# Patient Record
Sex: Female | Born: 1990 | Hispanic: No | Marital: Married | Smoking: Never smoker
Health system: Southern US, Community
[De-identification: ages and names within clinical notes are randomized; demographics above are authoritative.]

## PROBLEM LIST (undated history)

## (undated) DIAGNOSIS — D649 Anemia, unspecified: Secondary | ICD-10-CM

## (undated) DIAGNOSIS — R519 Headache, unspecified: Secondary | ICD-10-CM

## (undated) DIAGNOSIS — R87629 Unspecified abnormal cytological findings in specimens from vagina: Secondary | ICD-10-CM

## (undated) HISTORY — PX: TONSILLECTOMY: SUR1361

## (undated) HISTORY — DX: Headache, unspecified: R51.9

## (undated) HISTORY — DX: Anemia, unspecified: D64.9

## (undated) HISTORY — DX: Unspecified abnormal cytological findings in specimens from vagina: R87.629

---

## 2020-05-22 NOTE — L&D Delivery Note (Signed)
Called to come to delivery when patient was 7 cm and SROM.  Arrived in 5 " to a precipitous delivery attended by Ob Doc on call of a Female Apgars 9,9.   I arrived and delivered placenta with 3vc.  Cord blood obtained. Small posterior forchette 1st degree lac 1% lidocaine then 3-0 chromic repair Mother and baby doing well.  EBL 100cc

## 2020-08-24 LAB — OB RESULTS CONSOLE GC/CHLAMYDIA
Chlamydia: NEGATIVE
Gonorrhea: NEGATIVE

## 2020-08-24 LAB — OB RESULTS CONSOLE ANTIBODY SCREEN: Antibody Screen: NEGATIVE

## 2020-08-24 LAB — OB RESULTS CONSOLE HIV ANTIBODY (ROUTINE TESTING): HIV: NONREACTIVE

## 2020-08-24 LAB — OB RESULTS CONSOLE RPR: RPR: NONREACTIVE

## 2020-08-24 LAB — OB RESULTS CONSOLE ABO/RH: RH Type: NEGATIVE

## 2020-08-24 LAB — OB RESULTS CONSOLE RUBELLA ANTIBODY, IGM: Rubella: IMMUNE

## 2020-08-24 LAB — OB RESULTS CONSOLE HEPATITIS B SURFACE ANTIGEN: Hepatitis B Surface Ag: NEGATIVE

## 2020-09-21 ENCOUNTER — Encounter: Payer: Self-pay | Admitting: *Deleted

## 2020-09-27 ENCOUNTER — Ambulatory Visit: Payer: PRIVATE HEALTH INSURANCE | Admitting: *Deleted

## 2020-09-27 ENCOUNTER — Ambulatory Visit (HOSPITAL_BASED_OUTPATIENT_CLINIC_OR_DEPARTMENT_OTHER): Payer: PRIVATE HEALTH INSURANCE

## 2020-09-27 ENCOUNTER — Other Ambulatory Visit: Payer: Self-pay | Admitting: *Deleted

## 2020-09-27 ENCOUNTER — Other Ambulatory Visit: Payer: Self-pay | Admitting: Obstetrics and Gynecology

## 2020-09-27 ENCOUNTER — Ambulatory Visit: Payer: PRIVATE HEALTH INSURANCE | Attending: Obstetrics and Gynecology | Admitting: Obstetrics

## 2020-09-27 ENCOUNTER — Other Ambulatory Visit: Payer: Self-pay

## 2020-09-27 ENCOUNTER — Encounter: Payer: Self-pay | Admitting: *Deleted

## 2020-09-27 VITALS — BP 103/63 | HR 74 | Ht 64.0 in

## 2020-09-27 DIAGNOSIS — M329 Systemic lupus erythematosus, unspecified: Secondary | ICD-10-CM

## 2020-09-27 DIAGNOSIS — O99891 Other specified diseases and conditions complicating pregnancy: Secondary | ICD-10-CM | POA: Insufficient documentation

## 2020-09-27 DIAGNOSIS — Z3A13 13 weeks gestation of pregnancy: Secondary | ICD-10-CM | POA: Insufficient documentation

## 2020-09-27 DIAGNOSIS — D6862 Lupus anticoagulant syndrome: Secondary | ICD-10-CM

## 2020-09-27 DIAGNOSIS — Z363 Encounter for antenatal screening for malformations: Secondary | ICD-10-CM

## 2020-09-27 DIAGNOSIS — O99113 Other diseases of the blood and blood-forming organs and certain disorders involving the immune mechanism complicating pregnancy, third trimester: Secondary | ICD-10-CM

## 2020-09-27 DIAGNOSIS — O99111 Other diseases of the blood and blood-forming organs and certain disorders involving the immune mechanism complicating pregnancy, first trimester: Secondary | ICD-10-CM

## 2020-09-27 DIAGNOSIS — Z3A12 12 weeks gestation of pregnancy: Secondary | ICD-10-CM

## 2020-09-29 DIAGNOSIS — Z363 Encounter for antenatal screening for malformations: Secondary | ICD-10-CM | POA: Diagnosis not present

## 2020-09-29 DIAGNOSIS — O99111 Other diseases of the blood and blood-forming organs and certain disorders involving the immune mechanism complicating pregnancy, first trimester: Secondary | ICD-10-CM

## 2020-09-29 DIAGNOSIS — D6862 Lupus anticoagulant syndrome: Secondary | ICD-10-CM

## 2020-09-29 DIAGNOSIS — Z3A13 13 weeks gestation of pregnancy: Secondary | ICD-10-CM

## 2020-09-29 NOTE — Progress Notes (Signed)
MFM Consult Note  Kaylee Cervantes is a gravida 3 para 2 with an EDC of April 02, 2021 currently at 13 weeks and 2 days.  She was seen in consultation due to a history of lupus.  She reports that she was diagnosed with lupus in September 2017.  Her primary symptoms when she was diagnosed with lupus were hair loss, fatigue, body aches, and low blood pressures. Her lupus is being managed with her rheumatologist at [facility], Dr. Zenovia Jordan.  She reports that her last lupus flare was probably about a year ago.  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  She reports that the vasculitis has only started to improve after she was started on azathioprine.  The patient's current medications are:   Hydroxychloroquine (Plaquenil) 200 mg daily.  She reports that her last eye exam was performed in January 2021 which was within normal limits.   Prednisone 5 mg daily Azathioprine (Imuran) 150 mg daily.  The patient reports that she is taking azathioprine for treatment of vasculitis. Daily baby aspirin  Prenatal vitamins Vitamin D  She reports that she has screened positive for the SSA and SSB antibodies in the past.  She denies any other significant past medical history.  Her past obstetrical history includes a 37-week vaginal delivery and a 36-week vaginal delivery due to spontaneous preterm labor.  She is planning on receiving weekly 17-P injections in her current pregnancy to decrease her risk of another preterm birth.  The patient reports that she was followed by MFM in her last pregnancy in [place] due to lupus.  Her past surgical history includes removal of tonsils and adenoids in 2004.  An ultrasound performed today shows a singleton intrauterine pregnancy with a crown-rump length that is consistent with an EDC of April 02, 2021.  The patient reports that she had a cell free DNA test drawn last week to screen for fetal aneuploidy.  The  results of that test are currently pending.  During our consultation today, the following issues were discussed:  Lupus and pregnancy  The implications and management of lupus in pregnancy was discussed in detail with the patient today.  The increased risk of fetal growth restriction, an indicated preterm delivery, and early onset preeclampsia associated with lupus in pregnancy was discussed.     As her lupus is now under control with treatment, she was advised to continue taking Plaquenil and prednisone for the duration of her pregnancy as recommended by her rheumatologist.  Plaquenil and prednisone have been used safely in pregnancy.  As azathioprine is the only medication that that is treating her vasculitis, she was advised to continue taking azathioprine throughout her pregnancy as recommended by her rheumatologist.  She was advised that there is limited data regarding the use of azathioprine in pregnancy.  There have been some reports of fetal anomalies associated with the use of azathioprine, although some of the newer data have indicated that it is probably safe to use in pregnancy.  As the benefits of azathioprine use in her pregnancy probably outweighs the risk, she may continue this medication.  The 1% to 2% risk of a fetal congenital heart block in women who screen  positive for SSA, SSB antibodies was discussed.  She was advised that there has been some anecdotal evidence indicating that for women who are positive for the SSA or SSB antibodies, that taking Plaquenil may decrease the risk of congenital heart block.  The monitoring  of weekly PR intervals has not been shown to be effective in the prevention of a congenital heart block.  We will refer her for a fetal echocardiogram at around 22 weeks and will follow her with ultrasounds throughout her pregnancy.  She understands that should her fetus develop a congenital heart block, that treatment with a pacemaker after birth may be  necessary.  Due to lupus in pregnancy, she should undergo a 24-hour urine collection soon to determine the total amount of protein that she is excreting. I have scheduled a detailed fetal anatomy scan for her in our office at around 19 weeks.  We will continue to follow her with serial growth ultrasounds every 4 to 5 weeks. Weekly fetal testing should be started at around 32 weeks.  She should continue close follow-up with her rheumatologist throughout her pregnancy.  As she will have been treated with prednisone for the duration of her pregnancy, I would recommend that she receive stress dose steroids (Hydrocortisone 100 mg IV every 8 hours x3 doses) after delivery.  She should continue a daily baby aspirin for preeclampsia prophylaxis.  I advised the patient that due to her history of a prior preterm birth (at 36 weeks), she may receive weekly 17-P injections starting at around 16 weeks and continued until 36 weeks if she desires.  As her prior preterm birth was at 36 weeks, it is questionable if the weekly 17-P injections will provide her with a robust improvement in her pregnancy outcome. She undertsands that an indicated preterm delivery may be necessary should she develop any complications related to lupus.  The patient was advised that as she does not have any long-term sequelae or end organ damage related to lupus, that I anticipate that she will most likely have a good and successful pregnancy outcome.  She was reassured that should she develop complications during her pregnancy related to lupus such as preeclampsia, we will be available to help with management recommendations.  At the end of the consultation, the patient stated that all her questions had been answered to her complete satisfaction.  Thank you for referring this patient for a Maternal-Fetal Medicine consultation.  A total of 60 minutes was spent counseling and coordinating the care for this patient.   Recommendations for her  pregnancy:  Continue close follow-up with her rheumatologist Continue Plaquenil, prednisone, and azathioprine for management of lupus throughout her pregnancy Continue daily baby aspirin (81 mg) for preeclampsia prophylaxis Collect a baseline 24-hour urine to assess for total protein Start weekly 17-P injections at 16 weeks and continue until 36 weeks  Detailed fetal anatomy scan at 19 weeks followed by serial growth ultrasounds every 4 to 5 weeks Fetal echocardiogram at 22 weeks Weekly fetal testing starting at 32 weeks Stress dose steroids at delivery

## 2020-11-09 ENCOUNTER — Encounter: Payer: Self-pay | Admitting: *Deleted

## 2020-11-09 ENCOUNTER — Other Ambulatory Visit: Payer: Self-pay

## 2020-11-09 ENCOUNTER — Ambulatory Visit: Payer: PRIVATE HEALTH INSURANCE | Admitting: *Deleted

## 2020-11-09 ENCOUNTER — Ambulatory Visit: Payer: PRIVATE HEALTH INSURANCE | Attending: Obstetrics

## 2020-11-09 ENCOUNTER — Other Ambulatory Visit: Payer: Self-pay | Admitting: *Deleted

## 2020-11-09 VITALS — BP 110/56 | HR 69

## 2020-11-09 DIAGNOSIS — Z363 Encounter for antenatal screening for malformations: Secondary | ICD-10-CM | POA: Insufficient documentation

## 2020-11-09 DIAGNOSIS — M329 Systemic lupus erythematosus, unspecified: Secondary | ICD-10-CM

## 2020-11-09 DIAGNOSIS — O322XX0 Maternal care for transverse and oblique lie, not applicable or unspecified: Secondary | ICD-10-CM

## 2020-11-09 DIAGNOSIS — O99891 Other specified diseases and conditions complicating pregnancy: Secondary | ICD-10-CM

## 2020-11-09 DIAGNOSIS — Z3A19 19 weeks gestation of pregnancy: Secondary | ICD-10-CM

## 2020-11-15 ENCOUNTER — Other Ambulatory Visit: Payer: Self-pay

## 2020-12-09 ENCOUNTER — Ambulatory Visit: Payer: PRIVATE HEALTH INSURANCE | Admitting: *Deleted

## 2020-12-09 ENCOUNTER — Ambulatory Visit: Payer: PRIVATE HEALTH INSURANCE | Attending: Obstetrics

## 2020-12-09 ENCOUNTER — Other Ambulatory Visit: Payer: Self-pay

## 2020-12-09 VITALS — BP 113/59 | HR 78

## 2020-12-09 DIAGNOSIS — M329 Systemic lupus erythematosus, unspecified: Secondary | ICD-10-CM | POA: Insufficient documentation

## 2020-12-09 DIAGNOSIS — Z362 Encounter for other antenatal screening follow-up: Secondary | ICD-10-CM

## 2020-12-09 DIAGNOSIS — O09899 Supervision of other high risk pregnancies, unspecified trimester: Secondary | ICD-10-CM | POA: Diagnosis present

## 2020-12-09 DIAGNOSIS — O99891 Other specified diseases and conditions complicating pregnancy: Secondary | ICD-10-CM | POA: Diagnosis not present

## 2020-12-09 DIAGNOSIS — O09212 Supervision of pregnancy with history of pre-term labor, second trimester: Secondary | ICD-10-CM

## 2020-12-09 DIAGNOSIS — Z3A23 23 weeks gestation of pregnancy: Secondary | ICD-10-CM

## 2020-12-10 ENCOUNTER — Other Ambulatory Visit: Payer: Self-pay | Admitting: *Deleted

## 2020-12-10 DIAGNOSIS — M329 Systemic lupus erythematosus, unspecified: Secondary | ICD-10-CM

## 2021-01-06 ENCOUNTER — Ambulatory Visit: Payer: PRIVATE HEALTH INSURANCE

## 2021-03-24 ENCOUNTER — Other Ambulatory Visit: Payer: Self-pay

## 2021-03-24 ENCOUNTER — Inpatient Hospital Stay (HOSPITAL_COMMUNITY)
Admission: AD | Admit: 2021-03-24 | Discharge: 2021-03-24 | Disposition: A | Discharge status: Home / Self Care | Payer: PRIVATE HEALTH INSURANCE | Attending: Obstetrics and Gynecology | Admitting: Obstetrics and Gynecology

## 2021-03-24 ENCOUNTER — Encounter (HOSPITAL_COMMUNITY): Payer: Self-pay | Admitting: Obstetrics and Gynecology

## 2021-03-24 ENCOUNTER — Telehealth (HOSPITAL_COMMUNITY): Payer: Self-pay | Admitting: *Deleted

## 2021-03-24 DIAGNOSIS — Z3689 Encounter for other specified antenatal screening: Secondary | ICD-10-CM

## 2021-03-24 DIAGNOSIS — O479 False labor, unspecified: Secondary | ICD-10-CM

## 2021-03-24 DIAGNOSIS — Z3A38 38 weeks gestation of pregnancy: Secondary | ICD-10-CM | POA: Insufficient documentation

## 2021-03-24 DIAGNOSIS — O471 False labor at or after 37 completed weeks of gestation: Secondary | ICD-10-CM | POA: Diagnosis not present

## 2021-03-24 NOTE — MAU Provider Note (Signed)
None      S: Ms. Cesiah Westley is a 30 y.o. G3P1102 at [redacted]w[redacted]d  who presents to MAU today complaining contractions q 3 minutes since this evening. She denies vaginal bleeding. She denies LOF. She reports normal fetal movement.   States she was dilated 4 cm in the office earlier this week.   O: BP 119/74   Pulse 77   Temp 98.7 F (37.1 C) (Oral)   Resp 17   LMP 06/26/2020   SpO2 100%  GENERAL: Well-developed, well-nourished female in no acute distress.  HEAD: Normocephalic, atraumatic.  CHEST: Normal effort of breathing, regular heart rate ABDOMEN: Soft, nontender, gravid  Cervical exam:  Dilation: 4 Effacement (%): 60 Cervical Position: Posterior Station: -3 Presentation: Vertex Exam by:: Ezara Lipscomb,RN   Fetal Monitoring: Baseline: 130 Variability: moderate Accelerations: 15x15 Decelerations: none Contractions: Q3-12 minutes   A: SIUP at [redacted]w[redacted]d  False labor -cervix unchanged while in MAU & patient reports decrease in contractions  P: Discharge home Keep scheduled appointment on Monday Reviewed reasons to return to MAU  Judeth Horn, NP 03/24/2021 2:27 AM

## 2021-03-24 NOTE — Telephone Encounter (Signed)
Preadmission screen  

## 2021-03-24 NOTE — MAU Note (Signed)
..  Kaylee Cervantes is a 30 y.o. at [redacted]w[redacted]d here in MAU reporting: Contractions since 6pm that are now 2-3 minutes apart. Denies vaginal bleeding or leaking of fluid. +FM.  SVE on Monday was 4cm.

## 2021-03-28 NOTE — H&P (Signed)
Kaylee Cervantes is a 30 y.o. female presenting for IOL. Pregnancy complicated by several issues. She has had reassuring antenatal testing. Last US done at 47 wga showed EFW of 6#10 (75%ile). They ar eexpecting a RR boy.   # SLE/vasculitits: - followed by MFM and SLE physician - Has been on plaquenil, AZA, and prednisone.  - known SSA/SSB antibody positivity and has been counseled regarding this. Fetal ECHO was reassuring.  - Antenatal testing has been reassuring  # RH negative: - s/p rhogam  # Proteinuria: - baseline 24 hr urine protein is 197 - plans to follow with nephrology pp  # Hx PTB: - declined 17-OPH  OB History     Gravida  3   Para  2   Term  1   Preterm  1   AB      Living  2      SAB      IAB      Ectopic      Multiple      Live Births             Past Medical History:  Diagnosis Date   Anemia    Headache    Vaginal Pap smear, abnormal    Past Surgical History:  Procedure Laterality Date   TONSILLECTOMY     Family History: family history includes COPD in her maternal grandfather; Cancer in her maternal grandfather and paternal grandmother; Diabetes in her father; Hypertension in her father and mother; Kidney disease in her father; Stroke in her maternal grandfather and maternal grandmother. Social History:  reports that she has never smoked. She has never used smokeless tobacco. She reports that she does not currently use alcohol. She reports that she does not use drugs.     Maternal Diabetes: No Genetic Screening: Normal Maternal Ultrasounds/Referrals: Normal Fetal Ultrasounds or other Referrals:  Fetal echo Maternal Substance Abuse:  No Significant Maternal Medications:  Meds include: Other:  as above Significant Maternal Lab Results:  None and Group B Strep negative Other Comments:  None  Review of Systems History   Last menstrual period 06/26/2020. Exam Physical Exam  (from office) NAD, A&O NWOB Abd soft, nondistended,  gravid  Prenatal labs: ABO, Rh: B/Negative/-- (04/05 0000) Antibody: Negative (04/05 0000) Rubella: Immune (04/05 0000) RPR: Nonreactive (04/05 0000)  HBsAg: Negative (04/05 0000)  HIV: Non-reactive (04/05 0000)  GBS:   negative  Assessment/Plan: 30 yo G3P1011 # 81 wga presenting for IOL s/s SLE. Pregnancy history as above.    # SLE/vasculitis:  - Continue AZA and plaquenil - she has been counseled on potential risks in breastfeeding and overall benefits outweigh potential risks in her case - Stress dose steroids hydrocortisone 100mg  IV every 8 hours x 3 doses after delivery.  - restart prednisone 5mg  plans to d/w her rheum but likely on d/c from hospital - it may decrease milk supply.  - peds will be made aware regarding SSA/SSB Ab positive - no current evidence of heart block in baby  # IOL:  - cervix favorable - plan pitocin/AROM - GBS negative  03/28/2021, 11:52 AM

## 2021-03-29 ENCOUNTER — Inpatient Hospital Stay (HOSPITAL_COMMUNITY)
Admission: AD | Admit: 2021-03-29 | Discharge: 2021-03-30 | DRG: 807 | Disposition: A | Discharge status: Home / Self Care | Payer: PRIVATE HEALTH INSURANCE | Attending: Obstetrics and Gynecology | Admitting: Obstetrics and Gynecology

## 2021-03-29 ENCOUNTER — Encounter (HOSPITAL_COMMUNITY): Payer: Self-pay | Admitting: Obstetrics and Gynecology

## 2021-03-29 ENCOUNTER — Inpatient Hospital Stay (HOSPITAL_COMMUNITY): Payer: PRIVATE HEALTH INSURANCE

## 2021-03-29 ENCOUNTER — Inpatient Hospital Stay (HOSPITAL_COMMUNITY)
Admission: AD | Admit: 2021-03-29 | Payer: PRIVATE HEALTH INSURANCE | Source: Home / Self Care | Admitting: Obstetrics and Gynecology

## 2021-03-29 ENCOUNTER — Other Ambulatory Visit: Payer: Self-pay

## 2021-03-29 DIAGNOSIS — O99892 Other specified diseases and conditions complicating childbirth: Secondary | ICD-10-CM | POA: Diagnosis present

## 2021-03-29 DIAGNOSIS — Z6791 Unspecified blood type, Rh negative: Secondary | ICD-10-CM | POA: Diagnosis not present

## 2021-03-29 DIAGNOSIS — O4202 Full-term premature rupture of membranes, onset of labor within 24 hours of rupture: Secondary | ICD-10-CM | POA: Diagnosis not present

## 2021-03-29 DIAGNOSIS — O26893 Other specified pregnancy related conditions, third trimester: Principal | ICD-10-CM | POA: Diagnosis present

## 2021-03-29 DIAGNOSIS — M329 Systemic lupus erythematosus, unspecified: Secondary | ICD-10-CM | POA: Diagnosis present

## 2021-03-29 DIAGNOSIS — Z3490 Encounter for supervision of normal pregnancy, unspecified, unspecified trimester: Secondary | ICD-10-CM

## 2021-03-29 DIAGNOSIS — Z3A39 39 weeks gestation of pregnancy: Secondary | ICD-10-CM

## 2021-03-29 DIAGNOSIS — Z20822 Contact with and (suspected) exposure to covid-19: Secondary | ICD-10-CM | POA: Diagnosis present

## 2021-03-29 LAB — CBC
HCT: 34.3 % — ABNORMAL LOW (ref 36.0–46.0)
Hemoglobin: 11 g/dL — ABNORMAL LOW (ref 12.0–15.0)
MCH: 29 pg (ref 26.0–34.0)
MCHC: 32.1 g/dL (ref 30.0–36.0)
MCV: 90.5 fL (ref 80.0–100.0)
Platelets: 266 10*3/uL (ref 150–400)
RBC: 3.79 MIL/uL — ABNORMAL LOW (ref 3.87–5.11)
RDW: 13.8 % (ref 11.5–15.5)
WBC: 5.5 10*3/uL (ref 4.0–10.5)
nRBC: 0 % (ref 0.0–0.2)

## 2021-03-29 LAB — RESP PANEL BY RT-PCR (FLU A&B, COVID) ARPGX2
Influenza A by PCR: NEGATIVE
Influenza B by PCR: NEGATIVE
SARS Coronavirus 2 by RT PCR: NEGATIVE

## 2021-03-29 LAB — TYPE AND SCREEN
ABO/RH(D): B NEG
Antibody Screen: POSITIVE

## 2021-03-29 LAB — RPR: RPR Ser Ql: NONREACTIVE

## 2021-03-29 MED ORDER — AZATHIOPRINE 50 MG PO TABS
150.0000 mg | ORAL_TABLET | Freq: Every day | ORAL | Status: DC
  Filled 2021-03-29: qty 3

## 2021-03-29 MED ORDER — MEASLES, MUMPS & RUBELLA VAC IJ SOLR
0.5000 mL | Freq: Once | INTRAMUSCULAR | Status: DC
  Filled 2021-03-29: qty 0.5

## 2021-03-29 MED ORDER — ACETAMINOPHEN 325 MG PO TABS
650.0000 mg | ORAL_TABLET | ORAL | Status: DC | PRN
  Administered 2021-03-29: 650 mg via ORAL
  Filled 2021-03-29: qty 2

## 2021-03-29 MED ORDER — OXYTOCIN BOLUS FROM INFUSION
333.0000 mL | Freq: Once | INTRAVENOUS | Status: AC
  Administered 2021-03-29: 333 mL via INTRAVENOUS

## 2021-03-29 MED ORDER — ZOLPIDEM TARTRATE 5 MG PO TABS: 5.0000 mg | ORAL_TABLET | Freq: Every evening | ORAL | Status: DC | PRN

## 2021-03-29 MED ORDER — HYDROCORTISONE NICU INJ SYRINGE 50 MG/ML: 1.0000 mg/kg | Freq: Three times a day (TID) | INTRAVENOUS | Status: DC

## 2021-03-29 MED ORDER — HYDROXYZINE HCL 50 MG PO TABS: 50.0000 mg | ORAL_TABLET | Freq: Four times a day (QID) | ORAL | Status: DC | PRN

## 2021-03-29 MED ORDER — SOD CITRATE-CITRIC ACID 500-334 MG/5ML PO SOLN: 30.0000 mL | ORAL | Status: DC | PRN

## 2021-03-29 MED ORDER — PREDNISONE 5 MG PO TABS
5.0000 mg | ORAL_TABLET | Freq: Every day | ORAL | Status: DC
  Administered 2021-03-30: 5 mg via ORAL
  Filled 2021-03-29: qty 1

## 2021-03-29 MED ORDER — BENZOCAINE-MENTHOL 20-0.5 % EX AERO: 1.0000 "application " | INHALATION_SPRAY | CUTANEOUS | Status: DC | PRN

## 2021-03-29 MED ORDER — ONDANSETRON HCL 4 MG PO TABS: 4.0000 mg | ORAL_TABLET | ORAL | Status: DC | PRN

## 2021-03-29 MED ORDER — TERBUTALINE SULFATE 1 MG/ML IJ SOLN: 0.2500 mg | Freq: Once | INTRAMUSCULAR | Status: DC | PRN

## 2021-03-29 MED ORDER — OXYTOCIN-SODIUM CHLORIDE 30-0.9 UT/500ML-% IV SOLN
2.5000 [IU]/h | INTRAVENOUS | Status: DC
  Administered 2021-03-29: 2.5 [IU]/h via INTRAVENOUS

## 2021-03-29 MED ORDER — METHYLERGONOVINE MALEATE 0.2 MG/ML IJ SOLN
INTRAMUSCULAR | Status: AC
  Administered 2021-03-29: 0.2 mg via INTRAMUSCULAR
  Filled 2021-03-29: qty 1

## 2021-03-29 MED ORDER — ONDANSETRON HCL 4 MG/2ML IJ SOLN: 4.0000 mg | Freq: Four times a day (QID) | INTRAMUSCULAR | Status: DC | PRN

## 2021-03-29 MED ORDER — CARBOPROST TROMETHAMINE 250 MCG/ML IM SOLN: 250.0000 ug | Freq: Once | INTRAMUSCULAR | Status: DC

## 2021-03-29 MED ORDER — IBUPROFEN 600 MG PO TABS
600.0000 mg | ORAL_TABLET | Freq: Four times a day (QID) | ORAL | Status: DC
  Administered 2021-03-29 – 2021-03-30 (×5): 600 mg via ORAL
  Filled 2021-03-29 (×5): qty 1

## 2021-03-29 MED ORDER — METHYLERGONOVINE MALEATE 0.2 MG PO TABS: 0.2000 mg | ORAL_TABLET | ORAL | Status: DC | PRN

## 2021-03-29 MED ORDER — DIPHENHYDRAMINE HCL 25 MG PO CAPS: 25.0000 mg | ORAL_CAPSULE | Freq: Four times a day (QID) | ORAL | Status: DC | PRN

## 2021-03-29 MED ORDER — AZATHIOPRINE 50 MG PO TABS
50.0000 mg | ORAL_TABLET | Freq: Every day | ORAL | Status: DC
  Administered 2021-03-29: 50 mg via ORAL
  Filled 2021-03-29 (×2): qty 1

## 2021-03-29 MED ORDER — AZATHIOPRINE 50 MG PO TABS
150.0000 mg | ORAL_TABLET | Freq: Every day | ORAL | Status: DC
  Administered 2021-03-29: 100 mg via ORAL
  Filled 2021-03-29 (×2): qty 3

## 2021-03-29 MED ORDER — AZATHIOPRINE 50 MG PO TABS
100.0000 mg | ORAL_TABLET | Freq: Every day | ORAL | Status: DC
  Administered 2021-03-30: 100 mg via ORAL
  Filled 2021-03-29: qty 2

## 2021-03-29 MED ORDER — OXYCODONE-ACETAMINOPHEN 5-325 MG PO TABS: 2.0000 | ORAL_TABLET | ORAL | Status: DC | PRN

## 2021-03-29 MED ORDER — PRENATAL MULTIVITAMIN CH
1.0000 | ORAL_TABLET | Freq: Every day | ORAL | Status: DC
  Administered 2021-03-29 – 2021-03-30 (×2): 1 via ORAL
  Filled 2021-03-29 (×2): qty 1

## 2021-03-29 MED ORDER — DIBUCAINE (PERIANAL) 1 % EX OINT: 1.0000 "application " | TOPICAL_OINTMENT | CUTANEOUS | Status: DC | PRN

## 2021-03-29 MED ORDER — LIDOCAINE HCL (PF) 1 % IJ SOLN
30.0000 mL | INTRAMUSCULAR | Status: AC | PRN
  Administered 2021-03-29: 30 mL via SUBCUTANEOUS
  Filled 2021-03-29: qty 30

## 2021-03-29 MED ORDER — SENNOSIDES-DOCUSATE SODIUM 8.6-50 MG PO TABS
2.0000 | ORAL_TABLET | Freq: Every day | ORAL | Status: DC
  Administered 2021-03-30: 2 via ORAL
  Filled 2021-03-29: qty 2

## 2021-03-29 MED ORDER — ACETAMINOPHEN 325 MG PO TABS: 650.0000 mg | ORAL_TABLET | ORAL | Status: DC | PRN

## 2021-03-29 MED ORDER — BUTORPHANOL TARTRATE 1 MG/ML IJ SOLN: 1.0000 mg | INTRAMUSCULAR | Status: DC | PRN

## 2021-03-29 MED ORDER — OXYCODONE-ACETAMINOPHEN 5-325 MG PO TABS: 1.0000 | ORAL_TABLET | ORAL | Status: DC | PRN

## 2021-03-29 MED ORDER — HYDROCORTISONE SOD SUC (PF) 100 MG IJ SOLR
100.0000 mg | Freq: Three times a day (TID) | INTRAMUSCULAR | Status: AC
  Administered 2021-03-29 (×3): 100 mg via INTRAVENOUS
  Filled 2021-03-29 (×3): qty 2

## 2021-03-29 MED ORDER — MEDROXYPROGESTERONE ACETATE 150 MG/ML IM SUSP: 150.0000 mg | INTRAMUSCULAR | Status: DC | PRN

## 2021-03-29 MED ORDER — LACTATED RINGERS IV SOLN: INTRAVENOUS | Status: DC

## 2021-03-29 MED ORDER — ONDANSETRON HCL 4 MG/2ML IJ SOLN: 4.0000 mg | INTRAMUSCULAR | Status: DC | PRN

## 2021-03-29 MED ORDER — OXYTOCIN-SODIUM CHLORIDE 30-0.9 UT/500ML-% IV SOLN
1.0000 m[IU]/min | INTRAVENOUS | Status: DC
  Administered 2021-03-29: 2 m[IU]/min via INTRAVENOUS
  Filled 2021-03-29: qty 500

## 2021-03-29 MED ORDER — TETANUS-DIPHTH-ACELL PERTUSSIS 5-2.5-18.5 LF-MCG/0.5 IM SUSY: 0.5000 mL | PREFILLED_SYRINGE | Freq: Once | INTRAMUSCULAR | Status: DC

## 2021-03-29 MED ORDER — METHYLERGONOVINE MALEATE 0.2 MG/ML IJ SOLN: 0.2000 mg | INTRAMUSCULAR | Status: DC | PRN

## 2021-03-29 MED ORDER — HYDROXYCHLOROQUINE SULFATE 200 MG PO TABS
200.0000 mg | ORAL_TABLET | Freq: Two times a day (BID) | ORAL | Status: DC
  Filled 2021-03-29: qty 1

## 2021-03-29 MED ORDER — WITCH HAZEL-GLYCERIN EX PADS: 1.0000 "application " | MEDICATED_PAD | CUTANEOUS | Status: DC | PRN

## 2021-03-29 MED ORDER — HYDROXYCHLOROQUINE SULFATE 200 MG PO TABS
400.0000 mg | ORAL_TABLET | Freq: Every day | ORAL | Status: DC
  Administered 2021-03-29 – 2021-03-30 (×2): 400 mg via ORAL
  Filled 2021-03-29 (×3): qty 2

## 2021-03-29 MED ORDER — COCONUT OIL OIL: 1.0000 "application " | TOPICAL_OIL | Status: DC | PRN

## 2021-03-29 MED ORDER — SIMETHICONE 80 MG PO CHEW: 80.0000 mg | CHEWABLE_TABLET | ORAL | Status: DC | PRN

## 2021-03-29 MED ORDER — LACTATED RINGERS IV SOLN: 500.0000 mL | INTRAVENOUS | Status: DC | PRN

## 2021-03-29 NOTE — Lactation Note (Signed)
This note was copied from a baby's chart. Lactation Consultation Note  Patient Name: Kaylee Cervantes [id number] Date: 03/29/2021 Reason for consult: L&D Initial assessment Age:31 hours  P3, Ex BF.  Baby latched with mother's assistance off and on during consult with lips flanged and good sucking bursts. Lactation to follow up on MBU.  Maternal Data Does the patient have breastfeeding experience prior to this delivery?: Yes How long did the patient breastfeed?: 1.5-2 years  Feeding Mother's Current Feeding Choice: Breast Milk  LATCH Score Latch: Grasps breast easily, tongue down, lips flanged, rhythmical sucking.  Audible Swallowing: A few with stimulation  Type of Nipple: Everted at rest and after stimulation  Comfort (Breast/Nipple): Soft / non-tender  Hold (Positioning): No assistance needed to correctly position infant at breast.  LATCH Score: 9   Interventions Interventions: Skin to skin;Education   Consult Status Consult Status: Follow-up from L&D    Dahlia Byes [facility] 03/29/2021, 7:33 AM

## 2021-03-30 LAB — CBC
HCT: 34.6 % — ABNORMAL LOW (ref 36.0–46.0)
Hemoglobin: 11.2 g/dL — ABNORMAL LOW (ref 12.0–15.0)
MCH: 29.4 pg (ref 26.0–34.0)
MCHC: 32.4 g/dL (ref 30.0–36.0)
MCV: 90.8 fL (ref 80.0–100.0)
Platelets: 275 10*3/uL (ref 150–400)
RBC: 3.81 MIL/uL — ABNORMAL LOW (ref 3.87–5.11)
RDW: 13.6 % (ref 11.5–15.5)
WBC: 13.1 10*3/uL — ABNORMAL HIGH (ref 4.0–10.5)
nRBC: 0 % (ref 0.0–0.2)

## 2021-03-30 NOTE — Discharge Summary (Signed)
Postpartum Discharge Summary  Date of Service March 30, 2021     Patient Name: Kaylee Cervantes DOB: Jul 16, 1990 MRN: [id number]  Date of admission: 03/29/2021 Delivery date:03/29/2021  Delivering provider: Renard Matter  Date of discharge: 03/30/2021  Admitting diagnosis: Pregnancy [Z34.90] Intrauterine pregnancy: [redacted]w[redacted]d    Secondary diagnosis:  Active Problems:   Pregnancy  Additional problems: none    Discharge diagnosis: Term Pregnancy Delivered                                              Post partum procedures: not applicable Augmentation: Cytotec Complications: None  Hospital course: Induction of Labor With Vaginal Delivery   30y.o. yo [id number] admitted to the hospital 03/29/2021 for induction of labor.  Indication for induction: Favorable cervix at term.  Patient had an uncomplicated labor course as follows: Membrane Rupture Time/Date: 6:14 AM ,03/29/2021   Delivery Method:Vaginal, Spontaneous  Episiotomy: None  Lacerations:  1st degree  Details of delivery can be found in separate delivery note.  Patient had a routine postpartum course. Patient is discharged home 03/30/21.  Newborn Data: Birth date:03/29/2021  Birth time:6:44 AM  Gender:Female  Living status:Living  Apgars:8 ,9  Weight:3405 g   Magnesium Sulfate received: No BMZ received: No Rhophylac:N/A MMR:N/A T-DaP:Given prenatally Flu: N/A Transfusion:No  Physical exam  Vitals:   03/29/21 1313 03/29/21 1721 03/29/21 2130 03/29/21 2330  BP: (!) 142/88 (!) 141/82 122/75 (!) 111/59  Pulse: 69 67 68 75  Resp: 16 16 17 16   Temp: 97.7 F (36.5 C) 97.9 F (36.6 C) 98.1 F (36.7 C) 98.5 F (36.9 C)  TempSrc: Oral Oral Oral Oral  SpO2:  100% 100%   Weight:      Height:       General: alert Lochia: appropriate Uterine Fundus: firm Incision: Healing well with no significant drainage DVT Evaluation: No evidence of DVT seen on physical exam. Labs: Lab Results  Component Value Date   WBC  13.1 (H) 03/30/2021   HGB 11.2 (L) 03/30/2021   HCT 34.6 (L) 03/30/2021   MCV 90.8 03/30/2021   PLT 275 03/30/2021   No flowsheet data found. Edinburgh Score: Edinburgh Postnatal Depression Scale Screening Tool 03/29/2021  I have been able to laugh and see the funny side of things. 0  I have looked forward with enjoyment to things. 0  I have blamed myself unnecessarily when things went wrong. 1  I have been anxious or worried for no good reason. 0  I have felt scared or panicky for no good reason. 0  Things have been getting on top of me. 1  I have been so unhappy that I have had difficulty sleeping. 0  I have felt sad or miserable. 1  I have been so unhappy that I have been crying. 0  The thought of harming myself has occurred to me. 0  Edinburgh Postnatal Depression Scale Total 3      After visit meds:  Allergies as of 03/30/2021   No Known Allergies      Medication List     STOP taking these medications    aspirin EC 81 MG tablet       TAKE these medications    azaTHIOprine 50 MG tablet Commonly known as: IMURAN Take 150 mg by mouth daily.   cholecalciferol 25 MCG (1000 UNIT)  tablet Commonly known as: VITAMIN D3 Take 5,000 Units by mouth daily.   hydroxychloroquine 200 MG tablet Commonly known as: PLAQUENIL Take 400 mg by mouth daily.   predniSONE 5 MG tablet Commonly known as: DELTASONE Take 5 mg by mouth daily with breakfast.   prenatal multivitamin Tabs tablet Take 1 tablet by mouth daily at 12 noon.   PROBIOTIC DAILY PO Take by mouth.         Discharge home in stable condition Infant Feeding: Breast Infant Disposition:home with mother Discharge instruction: per After Visit Summary and Postpartum booklet. Activity: Advance as tolerated. Pelvic rest for 6 weeks.  Diet: routine diet Anticipated Birth Control: Unsure Postpartum Appointment:6 weeks Additional Postpartum F/U:  6 weeks Future Appointments:No future appointments. Follow up  Visit:      03/30/2021 Cyril Mourning, MD

## 2021-03-30 NOTE — Social Work (Signed)
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 CSW met with MOB to offer support and complete assessment.    CSW met with MOB at bedside and introduced CSW role. CSW observed MOB sitting in the recliner and FOB lying on the couch, the pediatrician was at bedside with the infant. MOB presented calm and receptive to CSW with FOB and the pediatrician present. CSW inquired how MOB has felt since giving birth. MOB expressed feeling good with a little soreness. MOB shared the L&D experience was much better this time because she did not Labor as long. CSW inquired how MOB felt emotionally during the pregnancy. MOB reported feeling very nervous about the delivery since her last baby had to stay a few days in the NICU before going home. CSW validated MOB concerns. CSW inquired if MOB has a mental health history. MOB shared she was diagnosed with ADHD as a child however for the past six years she feels her symptoms have worsened. MOB shared she often has "scatter brain and makes a list to remember task and to focus." MOB reported she is open to medication treatment and therapy. MOB shared she will discuss medication options with her PCP. CSW inquired about MOB history of PPD. 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 MOB shared during this time she was also dealing with her own health (Lupus). MOB shared she found comfort in talking with her spouse and mom. MOB shared this time she is willing to try therapy. CSW inquired about MOB coping mechanisms. MOB shared she cleans a lot and loves to bake desserts for others. CSW praised MOB for her efforts.   CSW provided education regarding the baby blues period vs. perinatal mood disorders, discussed treatment and gave resources for mental health follow up.  CSW recommended MOB complete a self-evaluation during the postpartum time period using the New Mom Checklist from Postpartum Progress and encouraged MOB to contact a  medical professional if symptoms are noted at any time. MOB reported she feels comfortable reaching out to her doctor if concerns arise.   CSW provided review of Sudden Infant Death Syndrome (SIDS) precautions. MOB reported she has essential items for the infant including a crib and bassinet where the infant will sleep. MOB has chosen [facility] in [place], [place] for infant's follow up care. CSW assessed MOB for additional needs. MOB reported no further needs.   CSW identifies no further need for intervention and no barriers to discharge at this time.   Kathrin Greathouse, MSW, LCSW [facility] [place] Worker  [phone] 03/30/2021  11:48 AM

## 2021-04-12 ENCOUNTER — Telehealth (HOSPITAL_COMMUNITY): Payer: Self-pay | Admitting: *Deleted

## 2021-04-12 NOTE — Telephone Encounter (Signed)
Phone voicemail message left to return nurse call.  Duffy Rhody, RN 04-12-2021 at 10:36am

## 2022-03-17 IMAGING — US US MFM OB DETAIL+14 WK
1 series · 13 of 28 positions shown · non-contrast
Comparison: none

[Series 1: us mfm ob detail+14 wk · 162 acquisitions, 13 frames shown]
[im 6/162]
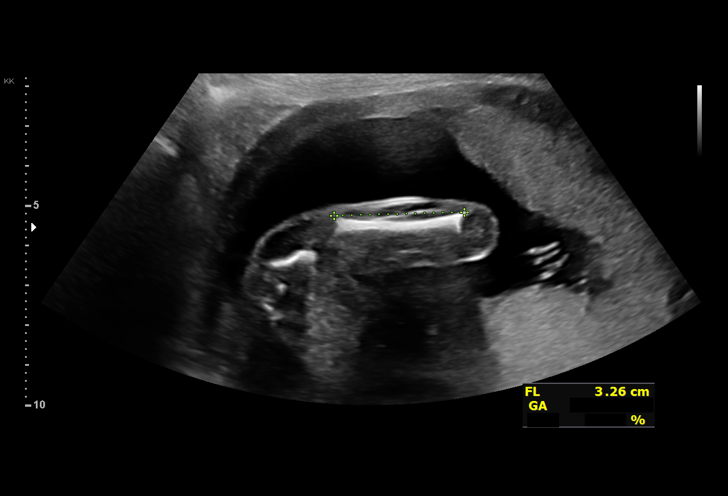
[im 18/162]
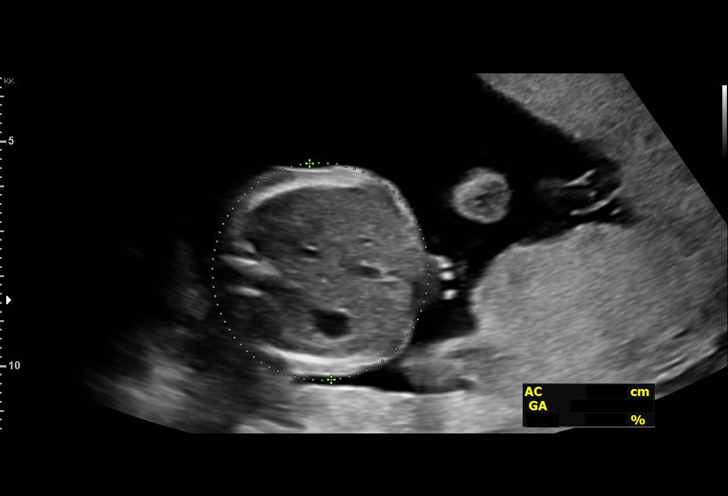
[im 30/162]
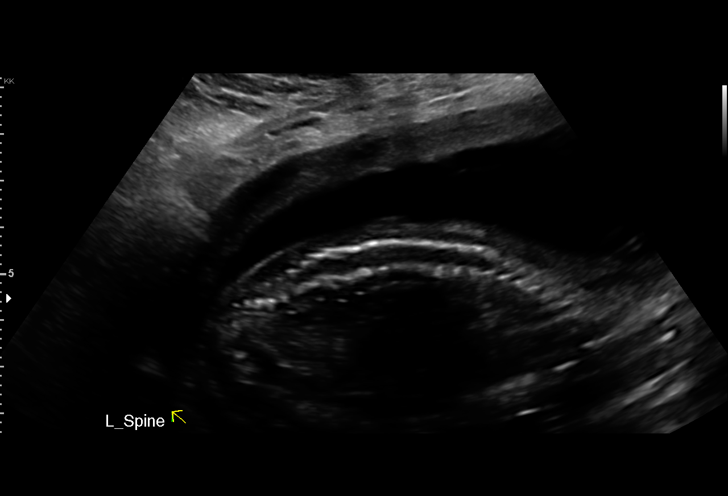
[im 42/162]
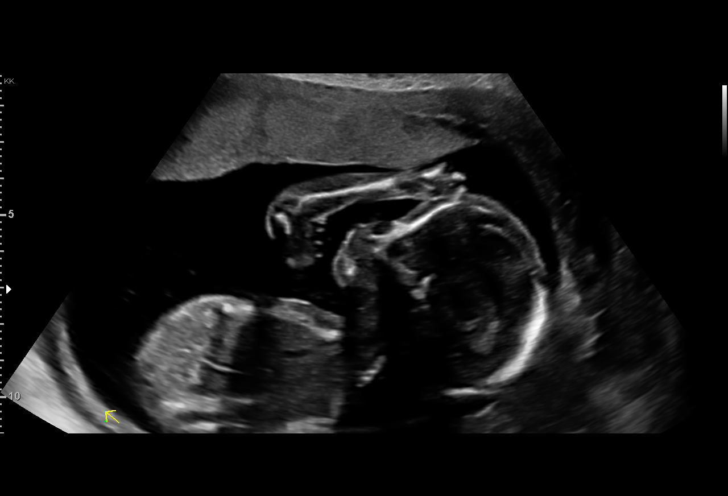
[im 54/162]
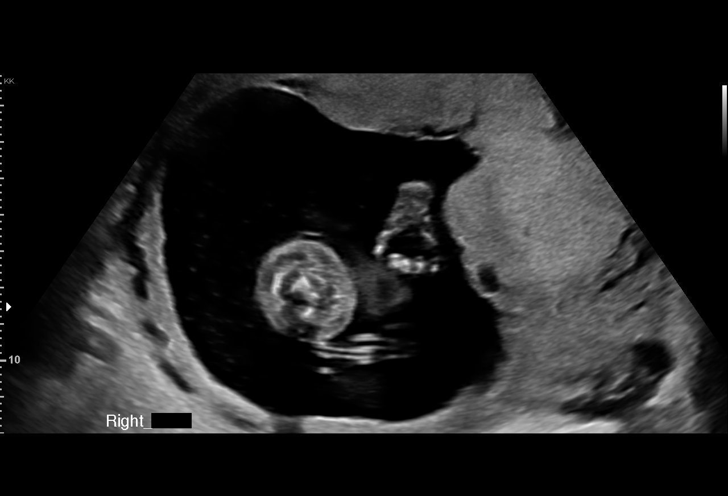
[im 66/162]
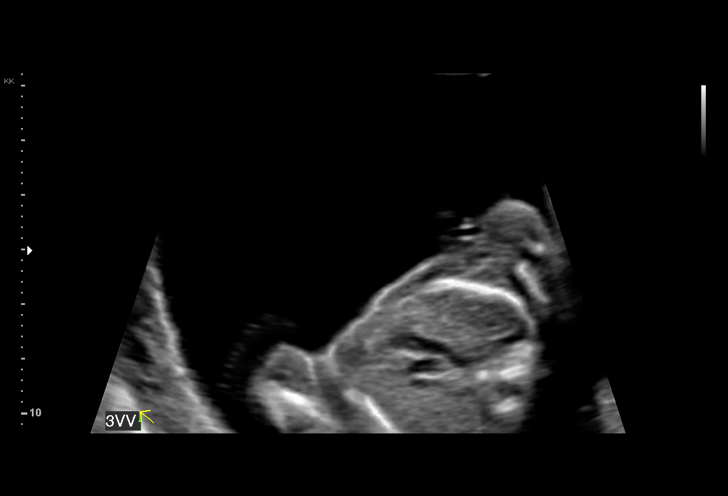
[im 84/162]
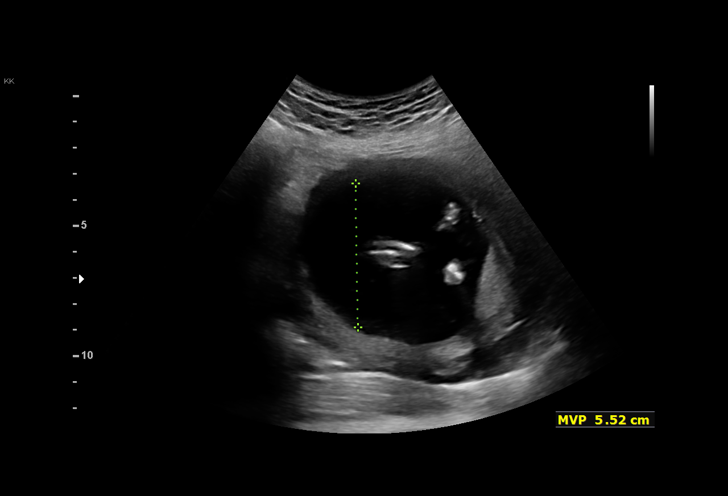
[im 96/162]
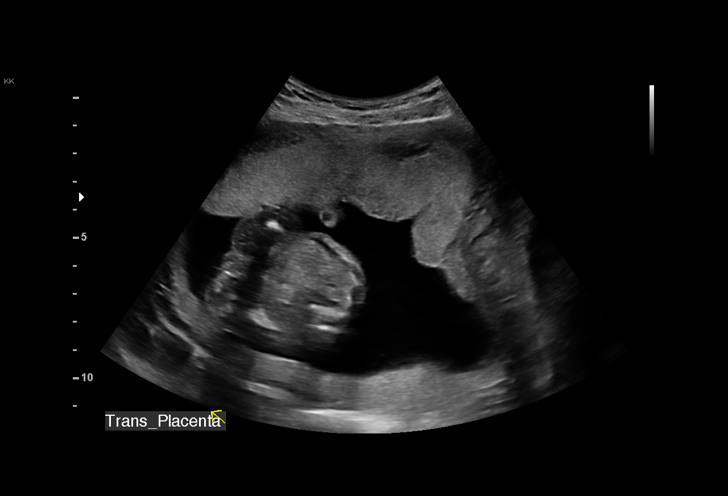
[im 108/162]
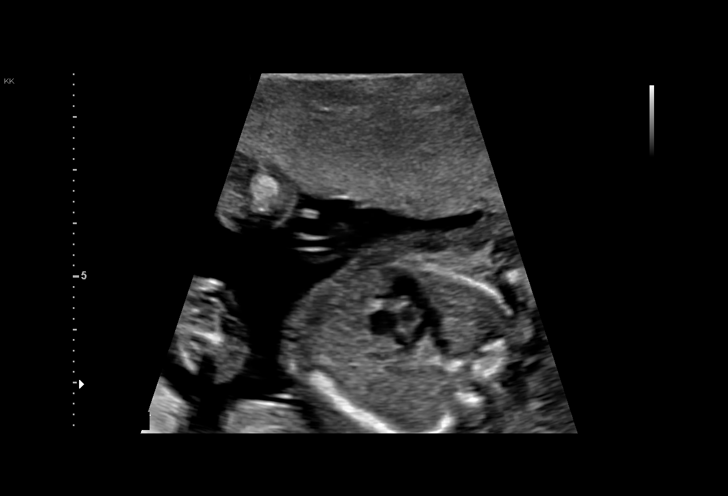
[im 120/162]
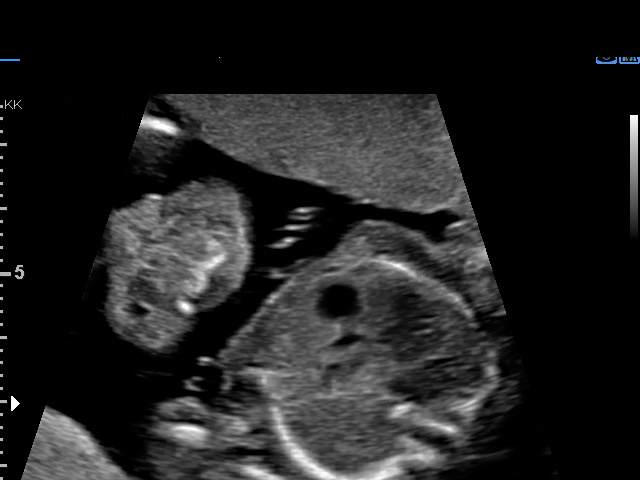
[im 132/162]
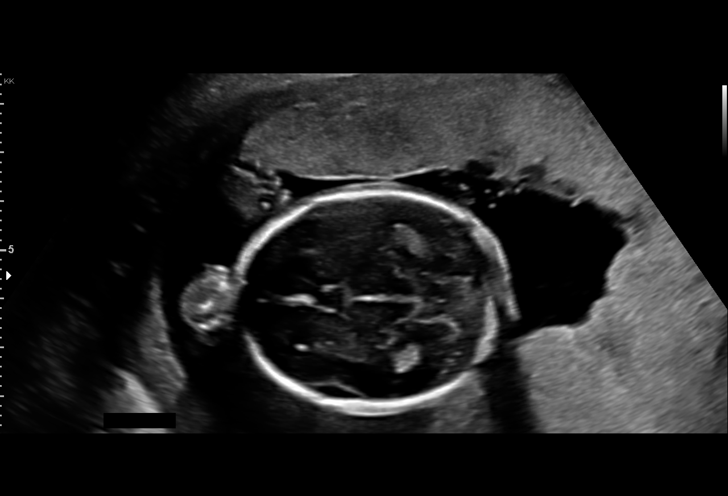
[im 144/162]
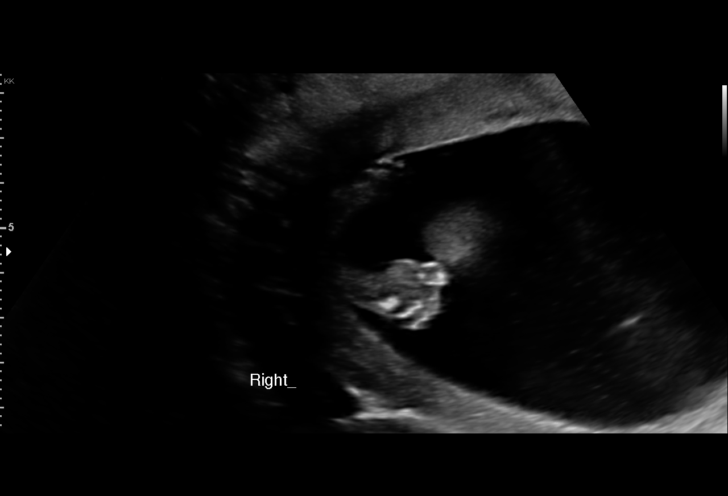
[im 156/162]
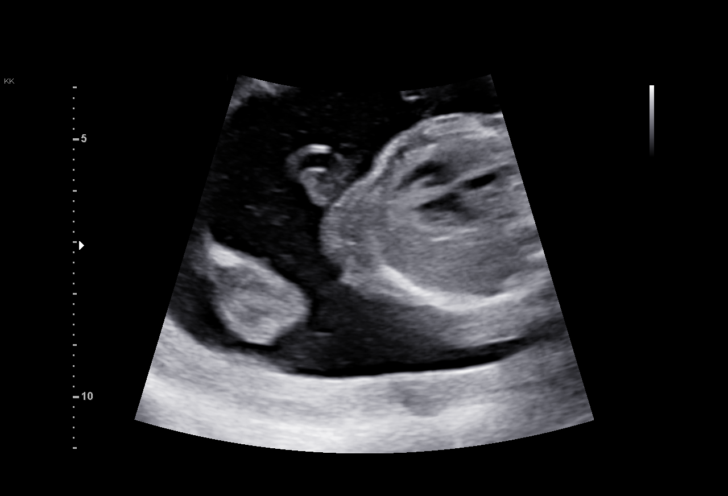

[13 of 28 positions shown; findings below may reference images not displayed]

Indications

 Systemic lupus complicating pregnancy,         O26.892,
 second trimester
 19 weeks gestation of pregnancy
 Encounter for antenatal screening for
 malformations
Fetal Evaluation

 Num Of Fetuses:         1
 Fetal Heart Rate(bpm):  152
 Cardiac Activity:       Observed
 Presentation:           Transverse, head to maternal left
 Placenta:               Anterior
 P. Cord Insertion:      Visualized

 Amniotic Fluid
 AFI FV:      Within normal limits

                             Largest Pocket(cm)

Biometry

 BPD:      43.8  mm     G. Age:  19w 2d         42  %    CI:        71.98   %    70 - 86
                                                         FL/HC:      19.6   %    16.1 -
 HC:      164.3  mm     G. Age:  19w 1d         30  %    HC/AC:      1.10        1.09 -
 AC:      149.2  mm     G. Age:  20w 1d         70  %    FL/BPD:     73.5   %
 FL:       32.2  mm     G. Age:  20w 0d         65  %    FL/AC:      21.6   %    20 - 24
 HUM:      31.4  mm     G. Age:  20w 3d         79  %
 CER:      20.5  mm     G. Age:  19w 5d         64  %
 NFT:       4.3  mm

 LV:        6.4  mm
 CM:        3.3  mm

 Est. FW:     325  gm    0 lb 11 oz      78  %
OB History

 Gravidity:    3         Term:   1        Prem:   1
 Living:       2
Gestational Age

 LMP:           19w 3d        Date:  06/26/20                 EDD:   04/02/21
 U/S Today:     19w 5d                                        EDD:   03/31/21
 Best:          19w 3d     Det. By:  LMP  (06/26/20)          EDD:   04/02/21
Anatomy

 Cranium:               Appears normal         Aortic Arch:            Appears normal
 Cavum:                 Appears normal         Ductal Arch:            Appears normal
 Ventricles:            Appears normal         Diaphragm:              Appears normal
 Choroid Plexus:        Appears normal         Stomach:                Appears normal, left
                                                                       sided
 Cerebellum:            Appears normal         Abdomen:                Appears normal
 Posterior Fossa:       Appears normal         Abdominal Wall:         Appears nml (cord
                                                                       insert, abd wall)
 Nuchal Fold:           Appears normal         Cord Vessels:           Appears normal (3
                                                                       vessel cord)
 Face:                  Appears normal         Kidneys:                Appear normal
                        (orbits and profile)
 Lips:                  Appears normal         Bladder:                Appears normal
 Thoracic:              Appears normal         Spine:                  Appears normal
 Heart:                 Appears normal         Upper Extremities:      Appears normal
                        (4CH, axis, and
                        situs)
 RVOT:                  Appears normal         Lower Extremities:      Appears normal
 LVOT:                  Appears normal

 Other:  Fetus appears to be a male. Heels visualized. Open Right hand
         visualized. Technically difficult due to fetal position.
Cervix Uterus Adnexa

 Cervix
 Length:            3.3  cm.
 Normal appearance by transabdominal scan.

 Adnexa
 No abnormality visualized.
Comments

 This patient was seen for a detailed fetal anatomy scan due
 to maternal lupus that is currently treated with azathioprine,
 Plaquenil, and prednisone.  She is being followed closely by
 her rheumatologist and reports that her blood work is
 improving.  She has screened positive for the FALLON and SSB
 antibodies.
 She denies any problems since her last exam.
 She had a cell free DNA test earlier in her pregnancy which
 indicated a low risk for trisomy 21, 18, and 13. A female fetus
 is predicted.
 She was informed that the fetal growth and amniotic fluid
 level were appropriate for her gestational age.
 There were no obvious fetal anomalies noted on today's
 ultrasound exam.
 The patient was informed that anomalies may be missed due
 to technical limitations. If the fetus is in a suboptimal position
 or maternal habitus is increased, visualization of the fetus in
 the maternal uterus may be impaired.
 As she has screened positive for the FALLON and SSB
 antibodies, she already has a fetal echocardiogram
 scheduled on December 08, 2020.
 Due to maternal lupus, we will continue to follow her with
 growth ultrasounds throughout her pregnancy.  Weekly fetal
 testing should be started at around 32 weeks.
 A follow-up exam was scheduled in 4 weeks.

## 2022-04-16 IMAGING — US US MFM OB FOLLOW-UP
1 series · 13 of 28 positions shown · non-contrast
Comparison: none

[Series 1: us mfm ob follow-up · 75 acquisitions, 13 frames shown]
[im 3/75]
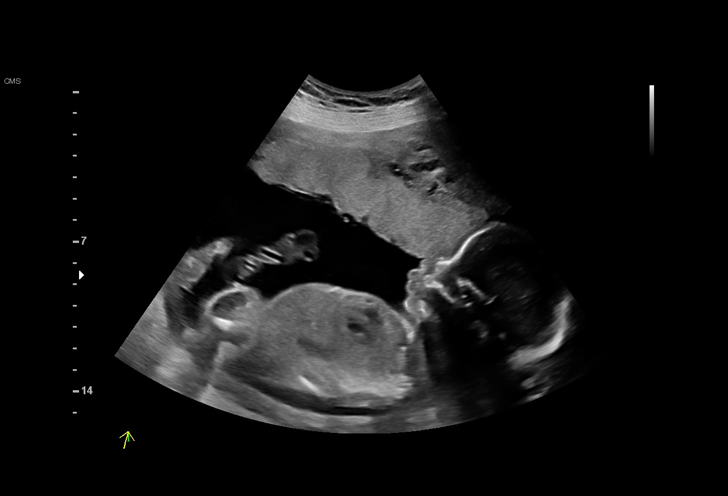
[im 9/75]
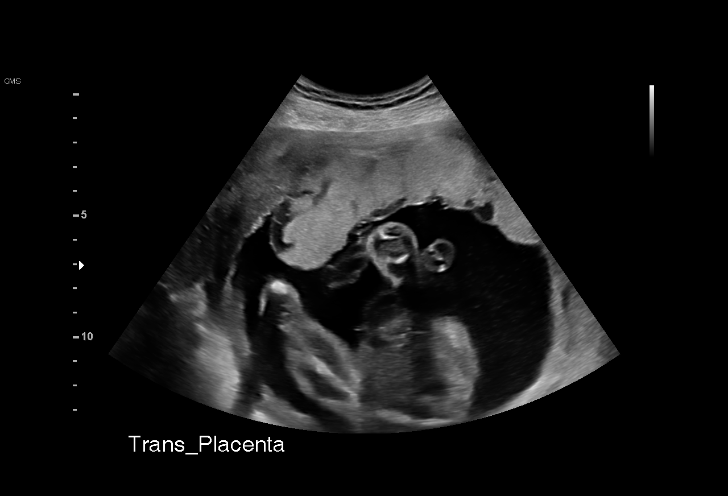
[im 14/75]
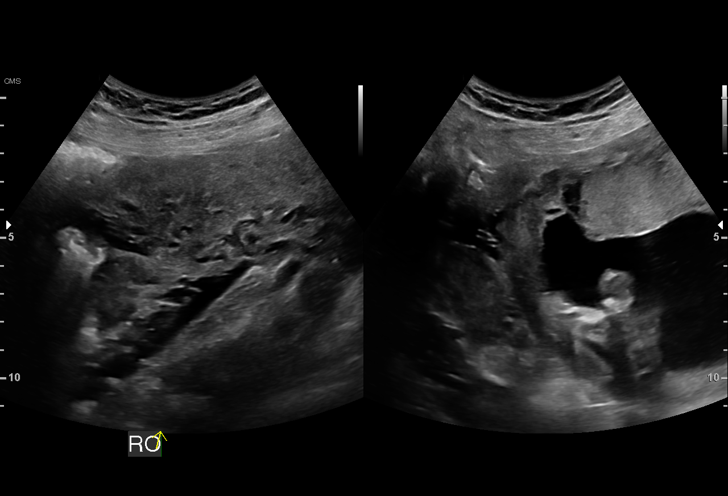
[im 20/75]
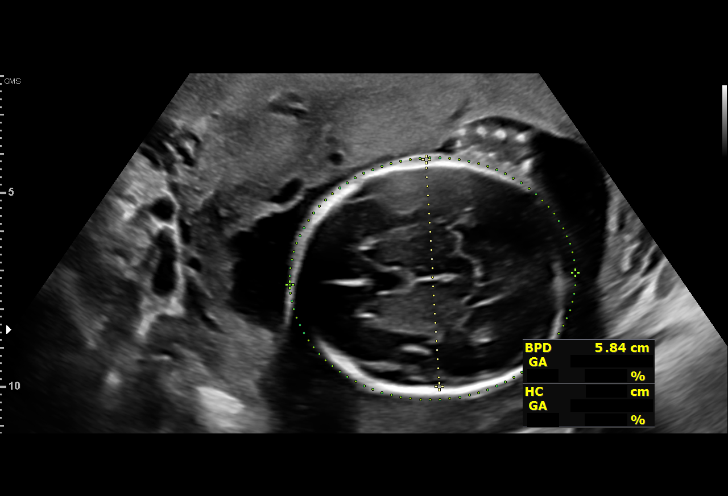
[im 25/75]
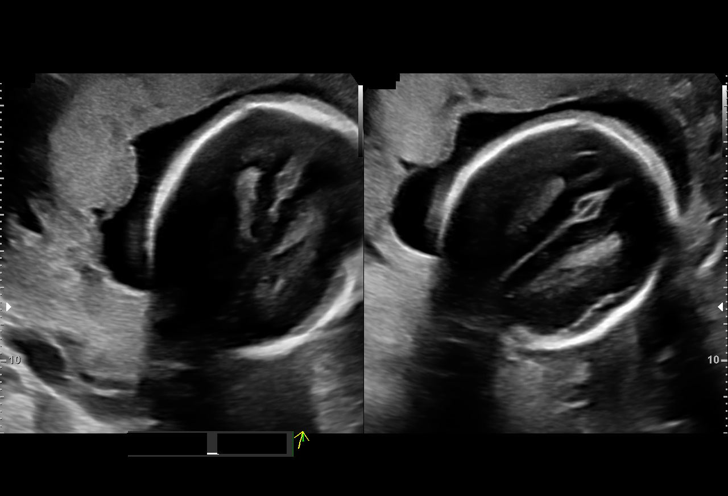
[im 31/75]
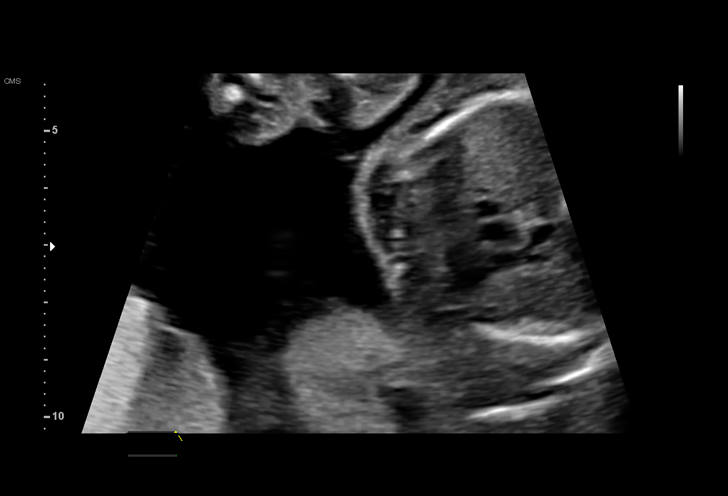
[im 39/75]
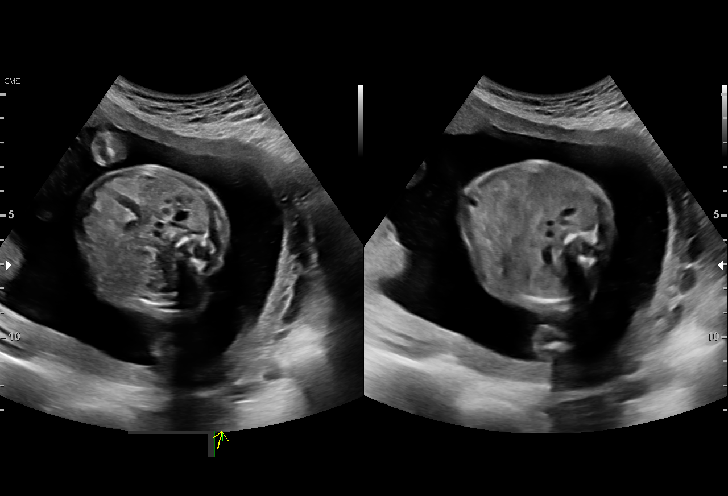
[im 44/75]
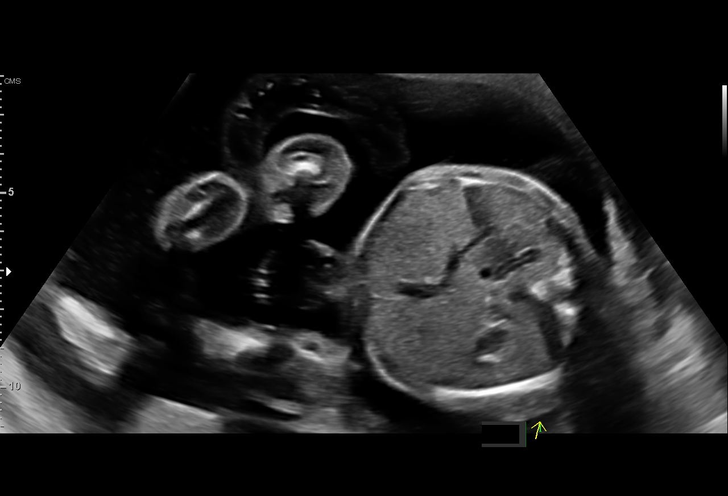
[im 50/75]
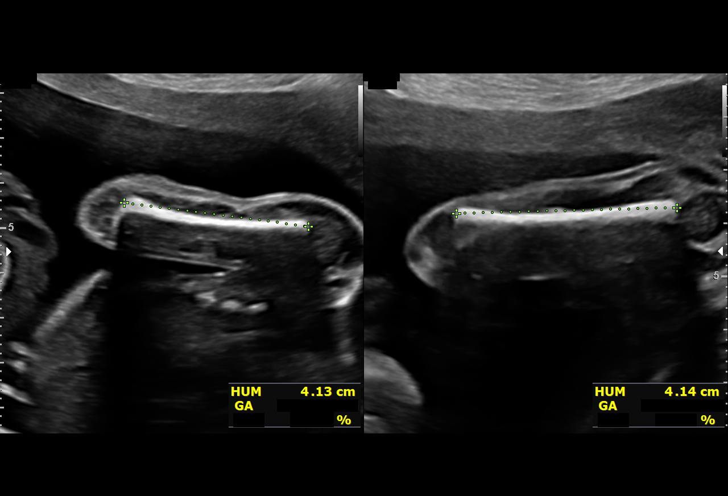
[im 55/75]
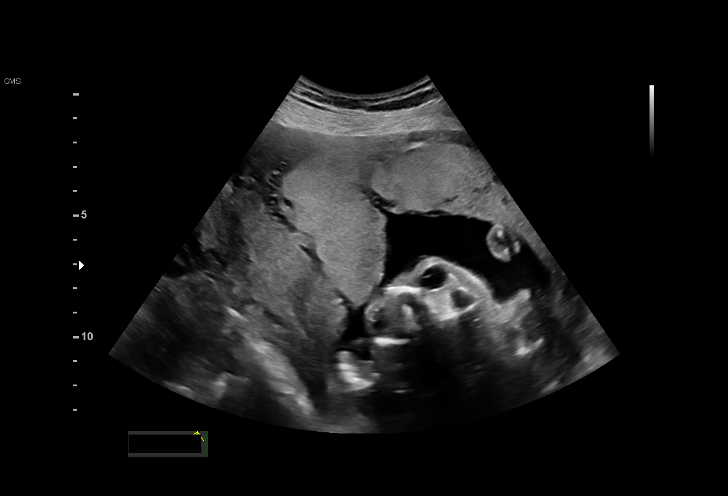
[im 61/75]
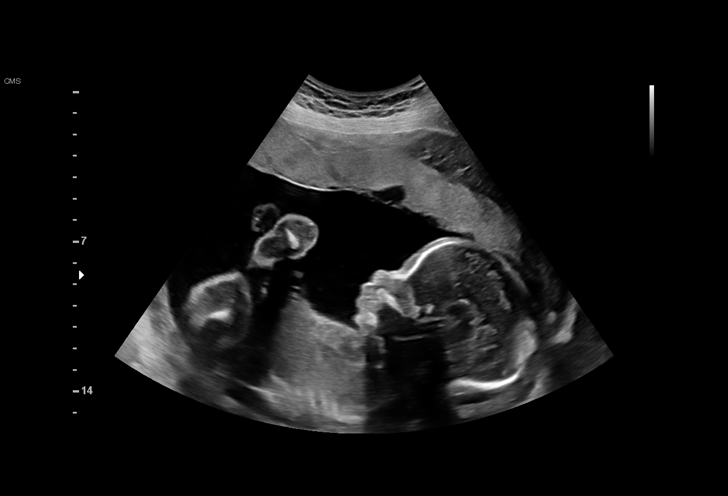
[im 66/75]
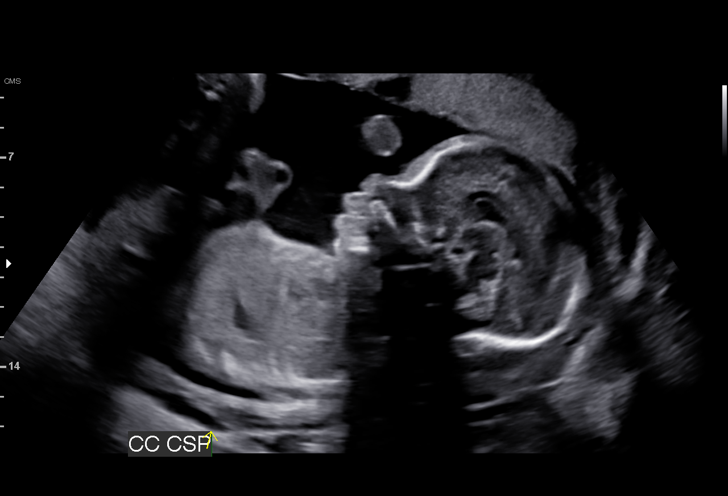
[im 72/75]
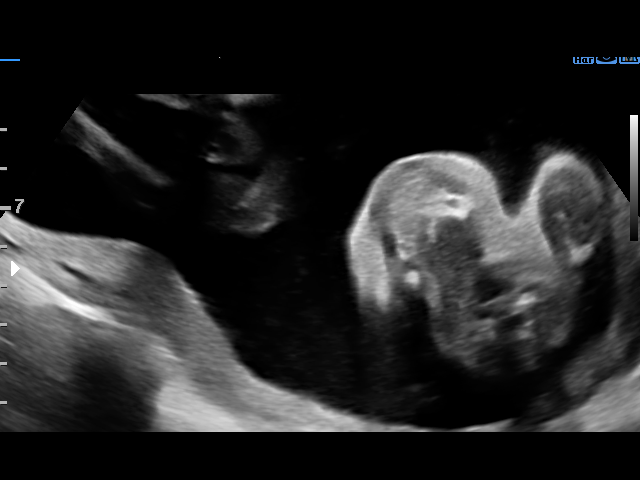

[13 of 28 positions shown; findings below may reference images not displayed]

Indications

 Encounter for other antenatal screening
 follow-up
 Systemic lupus complicating pregnancy,         O26.892,
 second trimester (+JEFFREY L/SSB)
 Normal Fetal ECHO with [HOSPITAL]
 Low risk NIPS
 Poor obstetric history: Previous preterm
 delivery, antepartum (36 weeks)
 23 weeks gestation of pregnancy
Fetal Evaluation

 Num Of Fetuses:         1
 Fetal Heart Rate(bpm):  144
 Cardiac Activity:       Observed
 Presentation:           Cephalic
 Placenta:               Anterior
 P. Cord Insertion:      Visualized

 Amniotic Fluid
 AFI FV:      Within normal limits

                             Largest Pocket(cm)

Biometry
 BPD:      58.4  mm     G. Age:  23w 6d         51  %    CI:        75.09   %    70 - 86
                                                         FL/HC:      20.4   %    18.7 -
 HC:      213.8  mm     G. Age:  23w 3d         23  %    HC/AC:      1.06        1.05 -
 AC:      201.4  mm     G. Age:  24w 5d         74  %    FL/BPD:     74.8   %    71 - 87
 FL:       43.7  mm     G. Age:  24w 2d         59  %    FL/AC:      21.7   %    20 - 24
 HUM:      41.4  mm     G. Age:  25w 0d         72  %
 CER:      24.7  mm     G. Age:  22w 4d         37  %

 LV:        5.7  mm

 Est. FW:     696  gm      1 lb 9 oz     76  %
OB History

 Gravidity:    3         Term:   1        Prem:   1
 Living:       2
Gestational Age

 LMP:           23w 5d        Date:  06/26/20                 EDD:   04/02/21
 U/S Today:     24w 1d                                        EDD:   03/30/21
 Best:          23w 5d     Det. By:  LMP  (06/26/20)          EDD:   04/02/21
Anatomy

 Cranium:               Appears normal         LVOT:                   Appears normal
 Cavum:                 Appears normal         Aortic Arch:            Appears normal
 Ventricles:            Appears normal         Ductal Arch:            Previously seen
 Choroid Plexus:        Appears normal         Diaphragm:              Appears normal
 Cerebellum:            Appears normal         Stomach:                Appears normal, left
                                                                       sided
 Posterior Fossa:       Appears normal         Abdomen:                Appears normal
 Nuchal Fold:           Previously seen        Abdominal Wall:         Appears nml (cord
                                                                       insert, abd wall)
 Face:                  Appears normal         Cord Vessels:           Appears normal (3
                        (orbits and profile)                           vessel cord)
 Lips:                  Appears normal         Kidneys:                Appear normal
 Palate:                Appears normal         Bladder:                Appears normal
 Thoracic:              Appears normal         Spine:                  Previously seen
 Heart:                 Appears normal         Upper Extremities:      Previously seen
                        (4CH, axis, and
                        situs)
 RVOT:                  Appears normal         Lower Extremities:      Previously seen

 Other:  Male gender previously visualized.  Heels previously visualized. Open
         hands/5th digits visualized. VC, 3VV and 3VTV visualized.
Cervix Uterus Adnexa

 Cervix
 Length:            3.7  cm.
 Normal appearance by transabdominal scan.

 Uterus
 No abnormality visualized.
 Right Ovary
 Within normal limits.

 Left Ovary
 Within normal limits.

 Cul De Sac
 No free fluid seen.

 Adnexa
 No abnormality visualized.
Impression

 Systemic lupus erythematosus.  Patient takes azathioprine,
 prednisone, and Plaquenil.  She did not have any flares in
 this pregnancy.  Increased Borketey and anti-SSB antibodies
 are present.
 Fetal echocardiography was reported as normal.

 Fetal growth is appropriate for gestational age.  Amniotic fluid
 is normal good fetal activity seen.  Fetal heart rate and
 rhythm appear normal.

 We reassured the patient of the findings.

 I informed the patient that if she wishes she may have her
 future ultrasound studies performed at your office.
Recommendations

 -An appointment was made for her to return in 4 weeks for
 fetal growth assessment.
 -Weekly BPP from 32 weeks gestation till delivery.
                 Andreia, Charonne
